# Patient Record
Sex: Male | Born: 1995 | Race: White | Hispanic: No | Marital: Single | Smoking: Never smoker
Health system: Southern US, Community
[De-identification: ages and names within clinical notes are randomized; demographics above are authoritative.]

## PROBLEM LIST (undated history)

## (undated) DIAGNOSIS — R011 Cardiac murmur, unspecified: Secondary | ICD-10-CM

## (undated) DIAGNOSIS — T7840XA Allergy, unspecified, initial encounter: Secondary | ICD-10-CM

## (undated) HISTORY — PX: TONSILLECTOMY: SUR1361

## (undated) HISTORY — DX: Allergy, unspecified, initial encounter: T78.40XA

## (undated) HISTORY — DX: Cardiac murmur, unspecified: R01.1

---
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
## 2021-12-11 ENCOUNTER — Encounter: Payer: Self-pay | Admitting: Family Medicine

## 2021-12-12 ENCOUNTER — Ambulatory Visit: Payer: Self-pay | Admitting: Family Medicine

## 2021-12-18 ENCOUNTER — Ambulatory Visit: Payer: Self-pay | Admitting: Family Medicine

## 2022-05-20 ENCOUNTER — Encounter: Payer: Self-pay | Admitting: Family Medicine

## 2022-05-20 ENCOUNTER — Ambulatory Visit: Payer: 59 | Admitting: Family Medicine

## 2022-05-20 VITALS — BP 128/72 | HR 86 | Temp 98.8°F | Wt 185.2 lb

## 2022-05-20 DIAGNOSIS — Z114 Encounter for screening for human immunodeficiency virus [HIV]: Secondary | ICD-10-CM

## 2022-05-20 DIAGNOSIS — G44209 Tension-type headache, unspecified, not intractable: Secondary | ICD-10-CM | POA: Diagnosis not present

## 2022-05-20 DIAGNOSIS — Z2981 Encounter for HIV pre-exposure prophylaxis: Secondary | ICD-10-CM | POA: Diagnosis not present

## 2022-05-20 DIAGNOSIS — Z7689 Persons encountering health services in other specified circumstances: Secondary | ICD-10-CM

## 2022-05-20 LAB — CBC WITH DIFFERENTIAL/PLATELET
Basophils Absolute: 0.1 10*3/uL (ref 0.0–0.1)
Basophils Relative: 1.2 % (ref 0.0–3.0)
Eosinophils Absolute: 0.1 10*3/uL (ref 0.0–0.7)
Eosinophils Relative: 1.7 % (ref 0.0–5.0)
HCT: 43.9 % (ref 39.0–52.0)
Hemoglobin: 14.9 g/dL (ref 13.0–17.0)
Lymphocytes Relative: 26 % (ref 12.0–46.0)
Lymphs Abs: 1.5 10*3/uL (ref 0.7–4.0)
MCHC: 34.1 g/dL (ref 30.0–36.0)
MCV: 84.7 fl (ref 78.0–100.0)
Monocytes Absolute: 0.6 10*3/uL (ref 0.1–1.0)
Monocytes Relative: 10.6 % (ref 3.0–12.0)
Neutro Abs: 3.4 10*3/uL (ref 1.4–7.7)
Neutrophils Relative %: 60.5 % (ref 43.0–77.0)
Platelets: 230 10*3/uL (ref 150.0–400.0)
RBC: 5.18 Mil/uL (ref 4.22–5.81)
RDW: 14.2 % (ref 11.5–15.5)
WBC: 5.6 10*3/uL (ref 4.0–10.5)

## 2022-05-20 LAB — COMPREHENSIVE METABOLIC PANEL
ALT: 18 U/L (ref 0–53)
AST: 22 U/L (ref 0–37)
Albumin: 4.9 g/dL (ref 3.5–5.2)
Alkaline Phosphatase: 67 U/L (ref 39–117)
BUN: 11 mg/dL (ref 6–23)
CO2: 24 mEq/L (ref 19–32)
Calcium: 9.5 mg/dL (ref 8.4–10.5)
Chloride: 103 mEq/L (ref 96–112)
Creatinine, Ser: 0.7 mg/dL (ref 0.40–1.50)
GFR: 127.88 mL/min (ref >60.00)
Glucose, Bld: 91 mg/dL (ref 70–99)
Potassium: 4.1 mEq/L (ref 3.5–5.1)
Sodium: 138 mEq/L (ref 135–145)
Total Bilirubin: 0.6 mg/dL (ref 0.2–1.2)
Total Protein: 7.7 g/dL (ref 6.0–8.3)

## 2022-05-20 MED ORDER — HYDROXYZINE PAMOATE 25 MG PO CAPS: 25.0000 mg | ORAL_CAPSULE | Freq: Three times a day (TID) | ORAL | 5 refills | Status: AC | PRN

## 2022-05-20 NOTE — Progress Notes (Signed)
Patient ID: Carl Yu, male  DOB: [birth year], 26 y.o.   MRN: [id number] Patient Care Team    Relationship Specialty Notifications Start End  Ma Hillock, DO PCP - General Family Medicine  05/20/22     Chief Complaint  Patient presents with   Headache    Thinks it is stress induced    Subjective: Carl Yu is a 26 y.o.  male present for new patient establishment. All past medical history, surgical history, allergies, family history, immunizations, medications and social history were updated in the electronic medical record today. All recent labs, ED visits and hospitalizations within the last year were reviewed.  Headaches: He reports he has noticed an increase in headaches.  His headaches typically will involve bilateral temples.  He states he experiences these sometimes on the way to work or while at work.  He is a Therapist, art at [employer].  He states he is looking on a monitor throughout his day.  He also has some stress surrounding a coworker that may be causing some tension.  Preprophylaxis: Patient reports he would like to be screened for HIV and discuss preprophylactic HIV medications.  He reports he is not sexually active currently, but he has been in the past.  He reports he feels well currently, he is trying to be proactive.     05/20/2022    1:39 PM  Depression screen PHQ 2/9  Decreased Interest 0  Down, Depressed, Hopeless 1  PHQ - 2 Score 1  Altered sleeping 0  Tired, decreased energy 1  Change in appetite 0  Feeling bad or failure about yourself  0  Trouble concentrating 0  Moving slowly or fidgety/restless 0  Suicidal thoughts 0  PHQ-9 Score 2      05/20/2022    1:40 PM  GAD 7 : Generalized Anxiety Score  Nervous, Anxious, on Edge 1  Control/stop worrying 0  Worry too much - different things 1  Trouble relaxing 1  Restless 0  Easily annoyed or irritable 0  Afraid - awful might happen 0  Total GAD 7 Score 3            No data to display           There is no immunization history on file for this patient.  No results found.  Past Medical History:  Diagnosis Date   Allergies    Heart murmur    No Known Allergies Past Surgical History:  Procedure Laterality Date   TONSILLECTOMY     Family History  Problem Relation Age of Onset   Hearing loss Mother    Miscarriages / [place] Mother    Alcohol abuse Father    Hearing loss Maternal Grandmother    Cancer Maternal Grandfather        Colon   Heart disease Maternal Grandfather    Hyperlipidemia Paternal Grandmother    Hearing loss Paternal Grandfather    Social History   Social History Narrative   Not on file    Allergies as of 05/20/2022   No Known Allergies      Medication List        Accurate as of May 20, 2022  4:52 PM. If you have any questions, ask your nurse or doctor.          hydrOXYzine 25 MG capsule Commonly known as: VISTARIL Take 1 capsule (25 mg total) by mouth every 8 (eight) hours as needed. Started by: Howard Pouch,  DO   loratadine 10 MG tablet Commonly known as: CLARITIN Take 1 tablet by mouth daily.        All past medical history, surgical history, allergies, family history, immunizations andmedications were updated in the EMR today and reviewed under the history and medication portions of their EMR.    No results found for this or any previous visit (from the past 2160 hour(s)).   ROS 14 pt review of systems performed and negative (unless mentioned in an HPI)  Objective: BP 128/72   Pulse 86   Temp 98.8 F (37.1 C)   Wt 185 lb 3.2 oz (84 kg)   SpO2 99%  Physical Exam Vitals and nursing note reviewed.  Constitutional:      General: He is not in acute distress.    Appearance: Normal appearance. He is not ill-appearing, toxic-appearing or diaphoretic.  HENT:     Head: Normocephalic and atraumatic.     Mouth/Throat:     Mouth: Mucous membranes are moist.  Eyes:     General:  No scleral icterus.       Right eye: No discharge.        Left eye: No discharge.     Extraocular Movements: Extraocular movements intact.     Pupils: Pupils are equal, round, and reactive to light.  Cardiovascular:     Rate and Rhythm: Normal rate and regular rhythm.  Pulmonary:     Effort: Pulmonary effort is normal. No respiratory distress.     Breath sounds: Normal breath sounds. No wheezing, rhonchi or rales.  Musculoskeletal:     Cervical back: Neck supple.  Lymphadenopathy:     Cervical: No cervical adenopathy.  Skin:    General: Skin is warm and dry.     Coloration: Skin is not jaundiced or pale.     Findings: No rash.  Neurological:     Mental Status: He is alert and oriented to person, place, and time. Mental status is at baseline.  Psychiatric:        Mood and Affect: Mood normal.        Behavior: Behavior normal.        Thought Content: Thought content normal.        Judgment: Judgment normal.      Assessment/plan: Carl Yu is a 26 y.o. male present for establish care Establishing care with new doctor, encounter for Tension headache Tension headaches possibly could be coming from screen time and/or ergonomics surrounding looking at a monitor daily. We discussed stretches of the neck to help with the tension. Vistaril 25 mg daily as needed for tension/headache prescribed. We discussed coworker/social situation and behavior modifications he can try. Follow-up as needed  Encounter for screening for HIV/Encounter for HIV pre-exposure prophylaxis - CBC w/Diff - Comp Met (CMET) - HIV antibody (with reflex) - Hepatitis, Acute - Ambulatory referral to Infectious Disease> to discuss PrEP options and initiate prophylaxis for him.  He understands he will need to be seen routinely, with routine testing while on PrEP program.    Return if symptoms worsen or fail to improve.  Orders Placed This Encounter  Procedures   Comp Met (CMET)   HIV antibody (with  reflex)   Hepatitis, Acute   CBC w/Diff   Ambulatory referral to Infectious Disease   Meds ordered this encounter  Medications   hydrOXYzine (VISTARIL) 25 MG capsule    Sig: Take 1 capsule (25 mg total) by mouth every 8 (eight) hours as needed.  Dispense:  30 capsule    Refill:  5   Referral Orders         Ambulatory referral to Infectious Disease       Note is dictated utilizing voice recognition software. Although note has been proof read prior to signing, occasional typographical errors still can be missed. If any questions arise, please do not hesitate to call for verification.  Electronically signed by: Howard Pouch, DO Kay

## 2022-05-20 NOTE — Patient Instructions (Addendum)
No follow-ups on file.        Great to see you today.  I have refilled the medication(s) we provide.   If labs were collected, we will inform you of lab results once received either by echart message or telephone call.   - echart message- for normal results that have been seen by the patient already.   - telephone call: abnormal results or if patient has not viewed results in their echart.   Tension Headache, Adult A tension headache is a feeling of pain, pressure, or aching in the head. It is often felt over the front and sides of the head. Tension headaches can last from 30 minutes to several days. What are the causes? The cause of this condition is not known. Sometimes, tension headaches are brought on by stress, worry (anxiety), or depression. Other things that may set them off include: Alcohol. Too much caffeine or caffeine withdrawal. Colds, flu, or sinus infections. Dental problems. This can include clenching your teeth. Being tired. Holding your head and neck in the same position for a long time, such as while using a computer. Smoking. Arthritis in the neck. What are the signs or symptoms? Feeling pressure around the head. A dull ache in the head. Pain over the front and sides of the head. Feeling sore or tender in the muscles of the head, neck, and shoulders. How is this treated? This condition may be treated with lifestyle changes and with medicines that help relieve symptoms. Follow these instructions at home: Managing pain Take over-the-counter and prescription medicines only as told by your doctor. When you have a headache, lie down in a dark, quiet room. If told, put ice on your head and neck. To do this: Put ice in a plastic bag. Place a towel between your skin and the bag. Leave the ice on for 20 minutes, 2-3 times a day. Take off the ice if your skin turns bright red. This is very important. If you cannot feel pain, heat, or cold, you have a greater risk  of damage to the area. If told, put heat on the back of your neck. Do this as often as told by your doctor. Use the heat source that your doctor recommends, such as a moist heat pack or a heating pad. Place a towel between your skin and the heat source. Leave the heat on for 20-30 minutes. Take off the heat if your skin turns bright red. This is very important. If you cannot feel pain, heat, or cold, you have a greater risk of getting burned. Eating and drinking Eat meals on a regular schedule. If you drink alcohol: Limit how much you have to: 0-1 drink a day for women who are not pregnant. 0-2 drinks a day for men. Know how much alcohol is in your drink. In the [place] one drink equals one 12 oz bottle of beer (355 mL), one 5 oz glass of wine (148 mL), or one 1 oz glass of hard liquor (44 mL). Drink enough fluid to keep your pee (urine) pale yellow. Do not use a lot of caffeine, or stop using caffeine. Lifestyle Get 7-9 hours of sleep each night. Or get the amount of sleep that your doctor tells you to. At bedtime, keep computers, phones, and tablets out of your room. Find ways to lessen your stress. This may include: Exercise. Deep breathing. Yoga. Listening to music. Thinking positive thoughts. Sit up straight. Try to relax your muscles. Do not smoke or use  any products that contain nicotine or tobacco. If you need help quitting, ask your doctor. General instructions  Avoid things that can bring on headaches. Keep a headache journal to see what may bring on headaches. For example, write down: What you eat and drink. How much sleep you get. Any change to your diet or medicines. Keep all follow-up visits. Contact a doctor if: Your headache does not get better. Your headache comes back. You have a headache, and sounds, light, or smells bother you. You feel like you may vomit, or you vomit. Your stomach hurts. Get help right away if: You all of a sudden get a very bad headache  with any of these things: A stiff neck. Feeling like you may vomit. Vomiting. Feeling mixed up (confused). Feeling weak in one part or one side of your body. Having trouble seeing or speaking, or both. Feeling short of breath. A rash. Feeling very sleepy. Pain in your eye or ear. Trouble walking or balancing. Feeling like you will faint, or you faint. Summary A tension headache is pain, pressure, or aching in your head. Tension headaches can last from 30 minutes to several days. Lifestyle changes and medicines may help relieve pain. This information is not intended to replace advice given to you by your health care provider. Make sure you discuss any questions you have with your health care provider. Document Revised: 04/06/2020 Document Reviewed: 04/06/2020 Else[employer] Patient Education  [place].

## 2022-05-21 LAB — HEPATITIS PANEL, ACUTE
Hep A IgM: NONREACTIVE
Hep B C IgM: NONREACTIVE
Hepatitis B Surface Ag: NONREACTIVE
Hepatitis C Ab: NONREACTIVE

## 2022-05-21 LAB — HIV ANTIBODY (ROUTINE TESTING W REFLEX): HIV 1&2 Ab, 4th Generation: NONREACTIVE
